# Patient Record
Sex: Male | Born: 1955 | Race: White | Hispanic: No | Marital: Single
Health system: Southern US, Community
[De-identification: ages and names within clinical notes are randomized; demographics above are authoritative.]

---

## 2014-08-02 ENCOUNTER — Ambulatory Visit
Admission: RE | Admit: 2014-08-02 | Discharge: 2014-08-02 | Disposition: A | Discharge status: Home / Self Care | Payer: BC Managed Care – PPO | Source: Ambulatory Visit | Attending: *Deleted | Admitting: *Deleted

## 2014-08-02 ENCOUNTER — Other Ambulatory Visit: Payer: Self-pay | Admitting: *Deleted

## 2014-08-02 DIAGNOSIS — R52 Pain, unspecified: Secondary | ICD-10-CM

## 2015-09-02 DEATH — deceased

## 2016-03-18 IMAGING — CR DG HIP W/ PELVIS BILAT
5 series · 5 of 5 positions shown · non-contrast
Comparison: None.

CLINICAL DATA: Fall 2 weeks ago.  Bilateral hip pain

EXAM:
BILATERAL HIP WITH PELVIS - 4+ VIEW

[t pelvis a.p.]
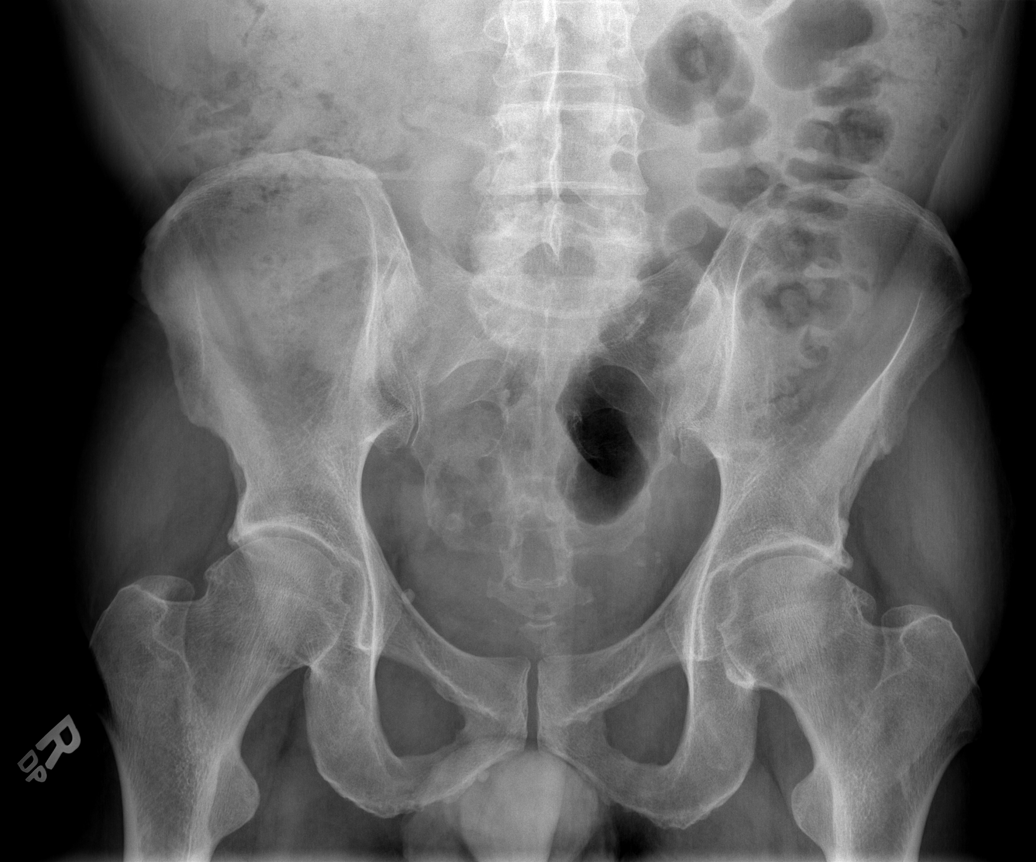

[t hip ap left]
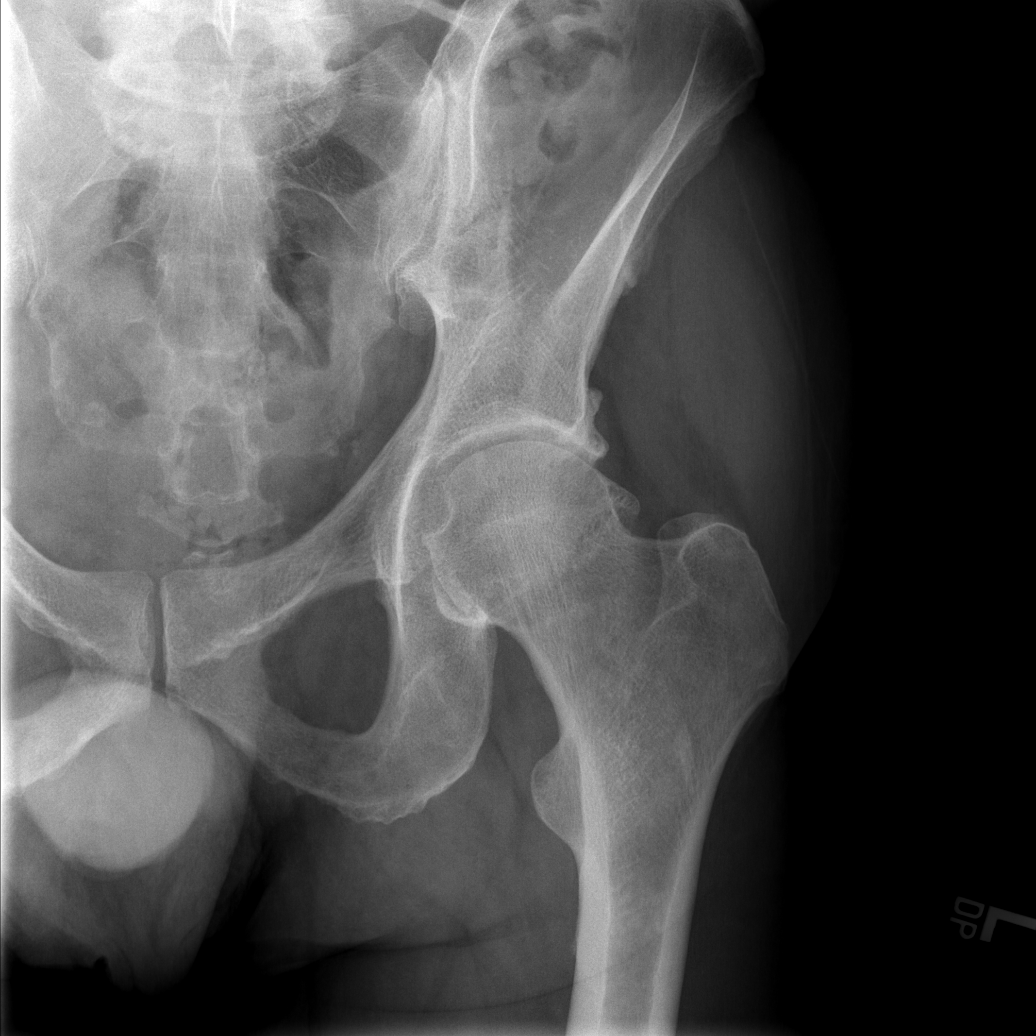

[t hip frog leg left]
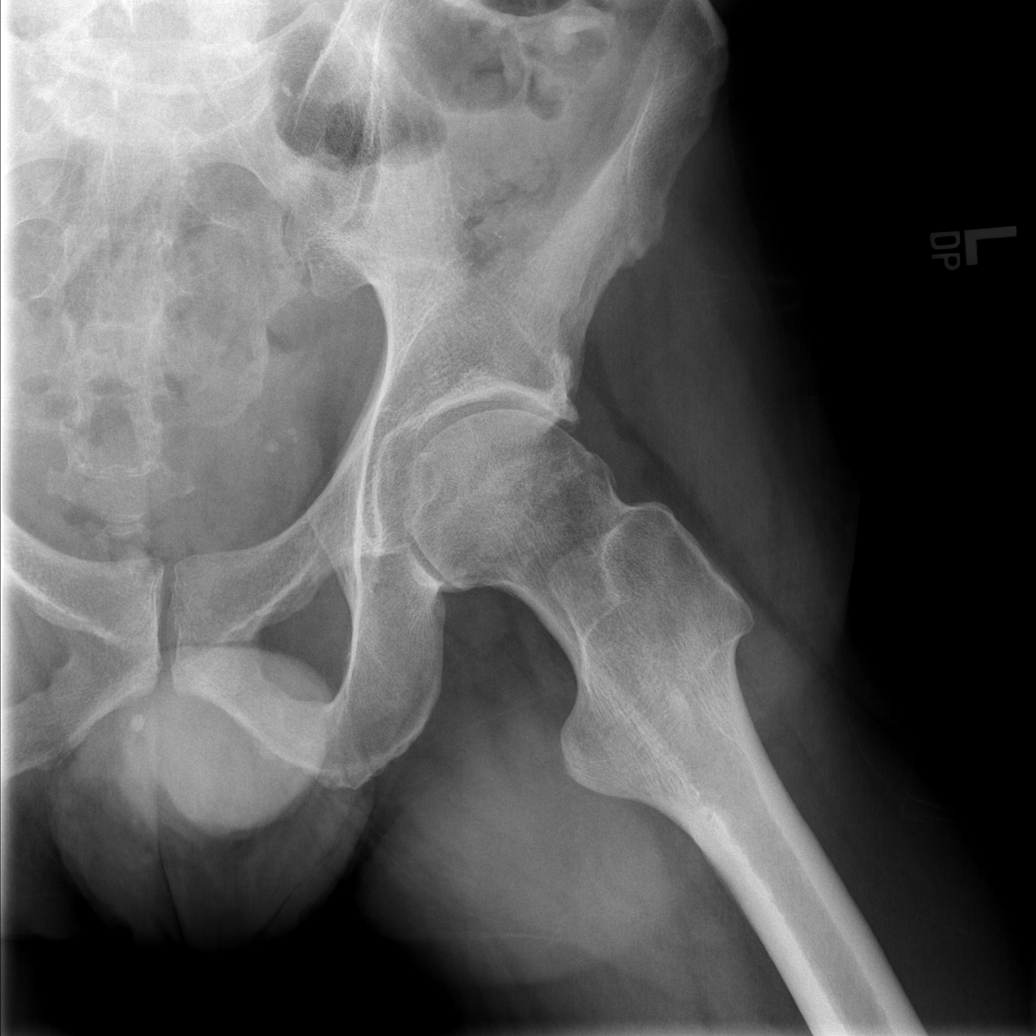

[t hip ap right]
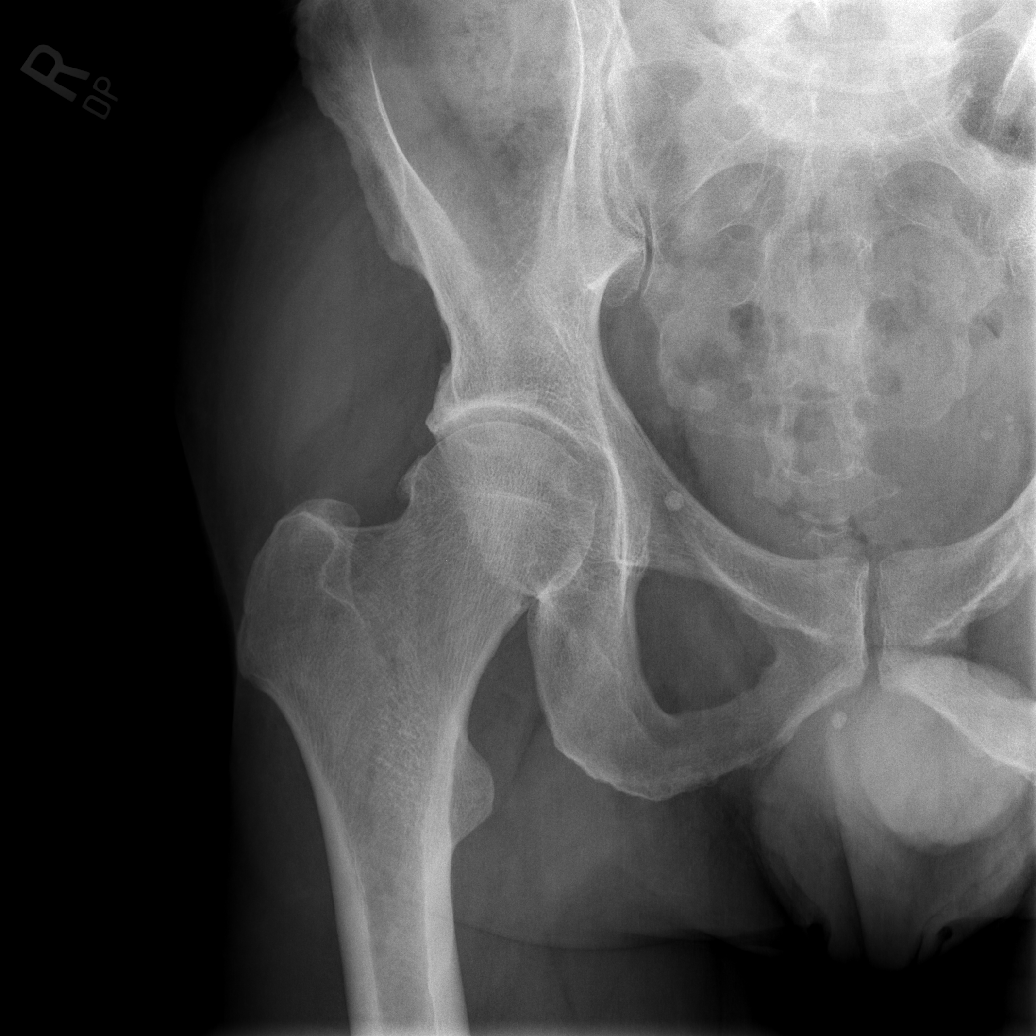

[t hip frog leg right]
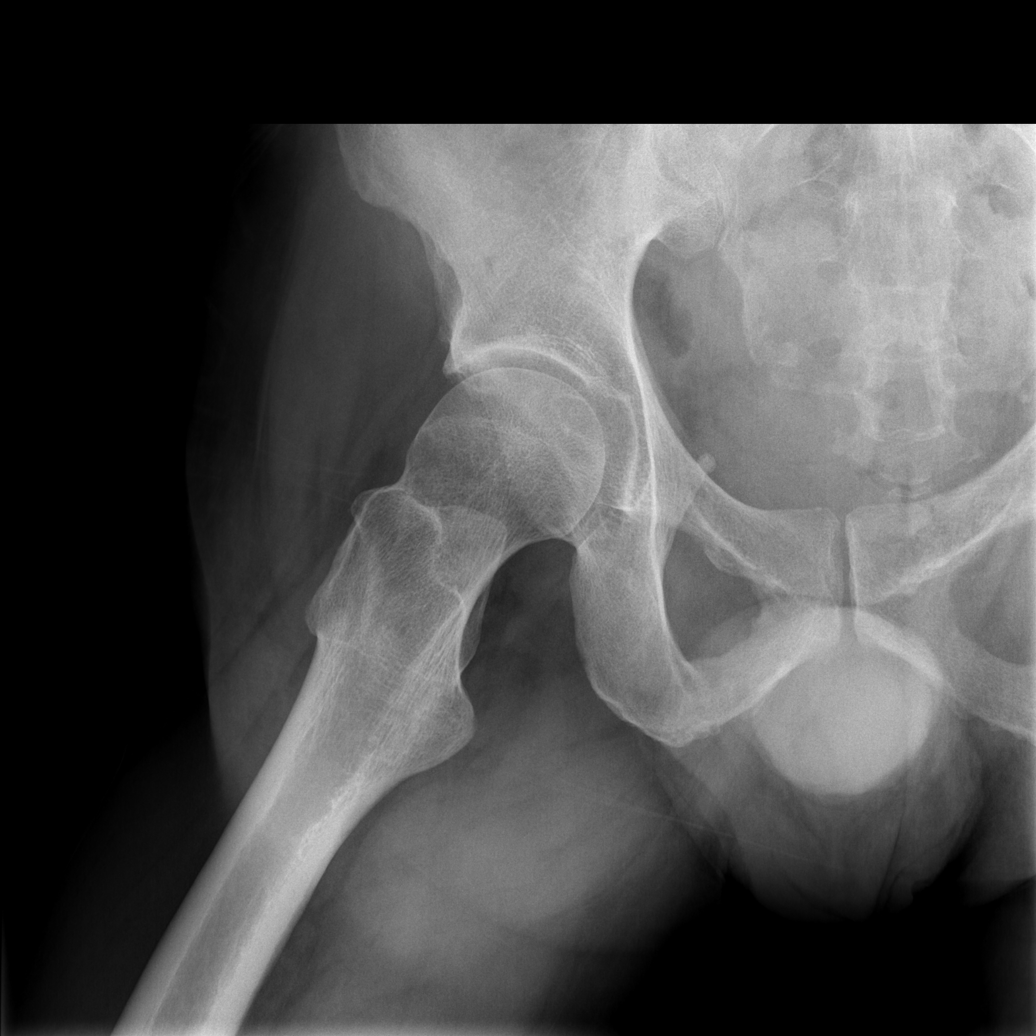

[5 of 5 positions shown; findings below may reference images not displayed]

FINDINGS: Negative for fracture.

Mild to moderate degenerative change in the left hip joint with
joint space narrowing and spurring. Mild degenerative change in the
right hip joint. Negative for AVN.
IMPRESSION: Negative for acute radiographic abnormality. Bilateral hip
osteoarthritis left greater than right
